# Patient Record
Sex: Male | Born: 1980 | Race: White | Hispanic: No | Marital: Married | Smoking: Never smoker
Health system: Southern US, Community
[De-identification: ages and names within clinical notes are randomized; demographics above are authoritative.]

## PROBLEM LIST (undated history)

## (undated) DIAGNOSIS — K5790 Diverticulosis of intestine, part unspecified, without perforation or abscess without bleeding: Secondary | ICD-10-CM

---
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
## 2010-10-19 HISTORY — PX: COLONOSCOPY: SHX174

## 2011-10-20 HISTORY — PX: WISDOM TOOTH EXTRACTION: SHX21

## 2011-12-14 ENCOUNTER — Encounter (HOSPITAL_COMMUNITY): Payer: Self-pay | Admitting: Pharmacy Technician

## 2011-12-14 ENCOUNTER — Other Ambulatory Visit: Payer: Self-pay | Admitting: Neurosurgery

## 2011-12-16 ENCOUNTER — Encounter (HOSPITAL_COMMUNITY)
Admission: RE | Admit: 2011-12-16 | Discharge: 2011-12-16 | Disposition: A | Discharge status: Home / Self Care | Payer: 59 | Source: Ambulatory Visit | Attending: Neurosurgery | Admitting: Neurosurgery

## 2011-12-16 ENCOUNTER — Encounter (HOSPITAL_COMMUNITY): Payer: Self-pay

## 2011-12-16 LAB — URINALYSIS, ROUTINE W REFLEX MICROSCOPIC
Ketones, ur: NEGATIVE mg/dL
Leukocytes, UA: NEGATIVE
Nitrite: NEGATIVE
Protein, ur: NEGATIVE mg/dL
Urobilinogen, UA: 0.2 mg/dL (ref 0.0–1.0)
pH: 7 (ref 5.0–8.0)

## 2011-12-16 LAB — CBC
MCV: 82.6 fL (ref 78.0–100.0)
Platelets: 293 10*3/uL (ref 150–400)
RDW: 12.3 % (ref 11.5–15.5)
WBC: 12 10*3/uL — ABNORMAL HIGH (ref 4.0–10.5)

## 2011-12-16 LAB — DIFFERENTIAL
Basophils Absolute: 0 10*3/uL (ref 0.0–0.1)
Basophils Relative: 0 % (ref 0–1)
Eosinophils Absolute: 0.2 10*3/uL (ref 0.0–0.7)
Eosinophils Relative: 2 % (ref 0–5)
Lymphocytes Relative: 22 % (ref 12–46)

## 2011-12-16 LAB — BASIC METABOLIC PANEL
CO2: 28 mEq/L (ref 19–32)
Calcium: 9.9 mg/dL (ref 8.4–10.5)
Creatinine, Ser: 0.86 mg/dL (ref 0.50–1.35)

## 2011-12-16 MED ORDER — CEFAZOLIN SODIUM 1-5 GM-% IV SOLN: 1.0000 g | INTRAVENOUS | Status: DC

## 2011-12-16 NOTE — Pre-Procedure Instructions (Signed)
22 Nicolas Patel  12/16/2011   Your procedure is scheduled on:  Thursday December 17, 2011  Report to [facility] at 10:30 AM.  Call this number if you have problems the morning of surgery: [phone]   Remember:   Do not eat food:After Midnight.  May have clear liquids: up to 4 Hours before arrival. ( up to 6:30am)  Clear liquids include soda, tea, black coffee, apple or grape juice, broth.  Take these medicines the morning of surgery with A SIP OF WATER:Hydrocodone   Do not wear jewelry, make-up or nail polish.  Do not wear lotions, powders, or perfumes. You may wear deodorant.  Do not shave 48 hours prior to surgery.  Do not bring valuables to the hospital.  Contacts, dentures or bridgework may not be worn into surgery.  Leave suitcase in the car. After surgery it may be brought to your room.  For patients admitted to the hospital, checkout time is 11:00 AM the day of discharge.   Patients discharged the day of surgery will not be allowed to drive home.  Name and phone number of your driver: *n/a**  Special Instructions: CHG Shower Use Special Wash: 1/2 bottle night before surgery and 1/2 bottle morning of surgery.   Please read over the following fact sheets that you were given: Pain Booklet, Coughing and Deep Breathing, MRSA Information and Surgical Site Infection Prevention

## 2011-12-17 ENCOUNTER — Encounter (HOSPITAL_COMMUNITY): Payer: Self-pay | Admitting: *Deleted

## 2011-12-17 ENCOUNTER — Encounter (HOSPITAL_COMMUNITY)
Admission: RE | Disposition: A | Discharge status: Home / Self Care | Payer: Self-pay | Source: Ambulatory Visit | Attending: Neurosurgery

## 2011-12-17 ENCOUNTER — Ambulatory Visit (HOSPITAL_COMMUNITY)
Admission: RE | Admit: 2011-12-17 | Discharge: 2011-12-18 | Disposition: A | Discharge status: Home / Self Care | Payer: 59 | Source: Ambulatory Visit | Attending: Neurosurgery | Admitting: Neurosurgery

## 2011-12-17 ENCOUNTER — Encounter (HOSPITAL_COMMUNITY): Payer: Self-pay | Admitting: Certified Registered"

## 2011-12-17 ENCOUNTER — Ambulatory Visit (HOSPITAL_COMMUNITY): Payer: 59 | Admitting: Certified Registered"

## 2011-12-17 ENCOUNTER — Ambulatory Visit (HOSPITAL_COMMUNITY): Payer: 59

## 2011-12-17 DIAGNOSIS — Z01812 Encounter for preprocedural laboratory examination: Secondary | ICD-10-CM | POA: Insufficient documentation

## 2011-12-17 DIAGNOSIS — M5106 Intervertebral disc disorders with myelopathy, lumbar region: Secondary | ICD-10-CM | POA: Insufficient documentation

## 2011-12-17 HISTORY — PX: LUMBAR LAMINECTOMY/DECOMPRESSION MICRODISCECTOMY: SHX5026

## 2011-12-17 LAB — PROTIME-INR
INR: 1 (ref 0.00–1.49)
Prothrombin Time: 13.4 seconds (ref 11.6–15.2)

## 2011-12-17 LAB — APTT: aPTT: 36 seconds (ref 24–37)

## 2011-12-17 SURGERY — LUMBAR LAMINECTOMY/DECOMPRESSION MICRODISCECTOMY
Anesthesia: General | Site: Back | Laterality: Right | Wound class: Clean

## 2011-12-17 MED ORDER — CEFAZOLIN SODIUM-DEXTROSE 2-3 GM-% IV SOLR
INTRAVENOUS | Status: AC
  Filled 2011-12-17: qty 50

## 2011-12-17 MED ORDER — ONDANSETRON HCL 4 MG/2ML IJ SOLN: 4.0000 mg | INTRAMUSCULAR | Status: DC | PRN

## 2011-12-17 MED ORDER — ACETAMINOPHEN 650 MG RE SUPP: 650.0000 mg | RECTAL | Status: DC | PRN

## 2011-12-17 MED ORDER — HYDROMORPHONE HCL PF 1 MG/ML IJ SOLN
INTRAMUSCULAR | Status: AC
  Filled 2011-12-17: qty 1

## 2011-12-17 MED ORDER — PROMETHAZINE HCL 25 MG PO TABS: 12.5000 mg | ORAL_TABLET | ORAL | Status: DC | PRN

## 2011-12-17 MED ORDER — ONDANSETRON HCL 4 MG/2ML IJ SOLN: 4.0000 mg | Freq: Once | INTRAMUSCULAR | Status: DC | PRN

## 2011-12-17 MED ORDER — VECURONIUM BROMIDE 10 MG IV SOLR
INTRAVENOUS | Status: DC | PRN
  Administered 2011-12-17: 6 mg via INTRAVENOUS

## 2011-12-17 MED ORDER — MORPHINE SULFATE 2 MG/ML IJ SOLN: 0.0500 mg/kg | INTRAMUSCULAR | Status: DC | PRN

## 2011-12-17 MED ORDER — PHENOL 1.4 % MT LIQD: 1.0000 | OROMUCOSAL | Status: DC | PRN

## 2011-12-17 MED ORDER — MEPERIDINE HCL 25 MG/ML IJ SOLN: 6.2500 mg | INTRAMUSCULAR | Status: DC | PRN

## 2011-12-17 MED ORDER — HYDROMORPHONE HCL PF 1 MG/ML IJ SOLN
0.2500 mg | INTRAMUSCULAR | Status: DC | PRN
  Administered 2011-12-17: 0.5 mg via INTRAVENOUS

## 2011-12-17 MED ORDER — FENTANYL CITRATE 0.05 MG/ML IJ SOLN
INTRAMUSCULAR | Status: DC | PRN
  Administered 2011-12-17: 75 ug via INTRAVENOUS
  Administered 2011-12-17: 50 ug via INTRAVENOUS
  Administered 2011-12-17: 75 ug via INTRAVENOUS
  Administered 2011-12-17: 50 ug via INTRAVENOUS
  Administered 2011-12-17: 100 ug via INTRAVENOUS
  Administered 2011-12-17: 50 ug via INTRAVENOUS

## 2011-12-17 MED ORDER — SODIUM CHLORIDE 0.9 % IJ SOLN
3.0000 mL | Freq: Two times a day (BID) | INTRAMUSCULAR | Status: DC
  Administered 2011-12-17: 3 mL via INTRAVENOUS

## 2011-12-17 MED ORDER — MORPHINE SULFATE 4 MG/ML IJ SOLN: 1.0000 mg | INTRAMUSCULAR | Status: DC | PRN

## 2011-12-17 MED ORDER — HEMOSTATIC AGENTS (NO CHARGE) OPTIME
TOPICAL | Status: DC | PRN
  Administered 2011-12-17: 1 via TOPICAL

## 2011-12-17 MED ORDER — DOCUSATE SODIUM 100 MG PO CAPS
100.0000 mg | ORAL_CAPSULE | Freq: Two times a day (BID) | ORAL | Status: DC
  Administered 2011-12-17: 100 mg via ORAL
  Filled 2011-12-17: qty 1

## 2011-12-17 MED ORDER — ADULT MULTIVITAMIN W/MINERALS CH
1.0000 | ORAL_TABLET | Freq: Every day | ORAL | Status: DC
  Filled 2011-12-17 (×2): qty 1

## 2011-12-17 MED ORDER — LIDOCAINE HCL (CARDIAC) 20 MG/ML IV SOLN
INTRAVENOUS | Status: DC | PRN
  Administered 2011-12-17: 60 mg via INTRAVENOUS

## 2011-12-17 MED ORDER — SODIUM CHLORIDE 0.9 % IV SOLN
INTRAVENOUS | Status: AC
  Filled 2011-12-17: qty 500

## 2011-12-17 MED ORDER — CYCLOBENZAPRINE HCL 10 MG PO TABS: 10.0000 mg | ORAL_TABLET | Freq: Three times a day (TID) | ORAL | Status: DC | PRN

## 2011-12-17 MED ORDER — PROPOFOL 10 MG/ML IV BOLUS
INTRAVENOUS | Status: DC | PRN
  Administered 2011-12-17: 200 mg via INTRAVENOUS

## 2011-12-17 MED ORDER — SODIUM CHLORIDE 0.9 % IJ SOLN: 3.0000 mL | INTRAMUSCULAR | Status: DC | PRN

## 2011-12-17 MED ORDER — LACTATED RINGERS IV SOLN
INTRAVENOUS | Status: DC | PRN
  Administered 2011-12-17 (×3): via INTRAVENOUS

## 2011-12-17 MED ORDER — MAGNESIUM HYDROXIDE 400 MG/5ML PO SUSP: 30.0000 mL | Freq: Every day | ORAL | Status: DC | PRN

## 2011-12-17 MED ORDER — THROMBIN 5000 UNITS EX SOLR
CUTANEOUS | Status: DC | PRN
  Administered 2011-12-17 (×2): 5000 [IU] via TOPICAL

## 2011-12-17 MED ORDER — BACITRACIN 50000 UNITS IM SOLR
INTRAMUSCULAR | Status: AC
  Filled 2011-12-17: qty 1

## 2011-12-17 MED ORDER — METHOCARBAMOL 100 MG/ML IJ SOLN: 500.0000 mg | Freq: Four times a day (QID) | INTRAVENOUS | Status: DC | PRN

## 2011-12-17 MED ORDER — KCL IN DEXTROSE-NACL 20-5-0.45 MEQ/L-%-% IV SOLN
INTRAVENOUS | Status: DC
  Filled 2011-12-17 (×3): qty 1000

## 2011-12-17 MED ORDER — MIDAZOLAM HCL 5 MG/5ML IJ SOLN
INTRAMUSCULAR | Status: DC | PRN
  Administered 2011-12-17: 2 mg via INTRAVENOUS

## 2011-12-17 MED ORDER — ZOLPIDEM TARTRATE 5 MG PO TABS: 10.0000 mg | ORAL_TABLET | Freq: Every evening | ORAL | Status: DC | PRN

## 2011-12-17 MED ORDER — PROMETHAZINE HCL 25 MG/ML IJ SOLN: 12.5000 mg | INTRAMUSCULAR | Status: DC | PRN

## 2011-12-17 MED ORDER — METHOCARBAMOL 500 MG PO TABS: 500.0000 mg | ORAL_TABLET | Freq: Four times a day (QID) | ORAL | Status: DC | PRN

## 2011-12-17 MED ORDER — CEFAZOLIN SODIUM-DEXTROSE 2-3 GM-% IV SOLR: 2.0000 g | INTRAVENOUS | Status: DC

## 2011-12-17 MED ORDER — ACETAMINOPHEN 325 MG PO TABS: 650.0000 mg | ORAL_TABLET | ORAL | Status: DC | PRN

## 2011-12-17 MED ORDER — SODIUM CHLORIDE 0.9 % IV SOLN: 250.0000 mL | INTRAVENOUS | Status: DC

## 2011-12-17 MED ORDER — LIDOCAINE-EPINEPHRINE 1 %-1:100000 IJ SOLN
INTRAMUSCULAR | Status: DC | PRN
  Administered 2011-12-17: 20 mL

## 2011-12-17 MED ORDER — SODIUM CHLORIDE 0.9 % IR SOLN
Status: DC | PRN
  Administered 2011-12-17: 17:00:00

## 2011-12-17 MED ORDER — OXYCODONE-ACETAMINOPHEN 5-325 MG PO TABS: 1.0000 | ORAL_TABLET | ORAL | Status: DC | PRN

## 2011-12-17 MED ORDER — CEFAZOLIN SODIUM 1-5 GM-% IV SOLN
1.0000 g | Freq: Three times a day (TID) | INTRAVENOUS | Status: AC
  Administered 2011-12-17 – 2011-12-18 (×2): 1 g via INTRAVENOUS
  Filled 2011-12-17 (×2): qty 50

## 2011-12-17 MED ORDER — 0.9 % SODIUM CHLORIDE (POUR BTL) OPTIME
TOPICAL | Status: DC | PRN
  Administered 2011-12-17: 1000 mL

## 2011-12-17 MED ORDER — LIDOCAINE HCL 4 % MT SOLN
OROMUCOSAL | Status: DC | PRN
  Administered 2011-12-17: 4 mL via TOPICAL

## 2011-12-17 MED ORDER — KETOROLAC TROMETHAMINE 30 MG/ML IJ SOLN
30.0000 mg | Freq: Four times a day (QID) | INTRAMUSCULAR | Status: DC
  Administered 2011-12-17 – 2011-12-18 (×2): 30 mg via INTRAVENOUS
  Filled 2011-12-17 (×4): qty 1

## 2011-12-17 MED ORDER — HYDROCODONE-ACETAMINOPHEN 5-325 MG PO TABS
1.0000 | ORAL_TABLET | ORAL | Status: DC | PRN
  Administered 2011-12-17: 2 via ORAL
  Filled 2011-12-17: qty 2

## 2011-12-17 MED ORDER — MENTHOL 3 MG MT LOZG: 1.0000 | LOZENGE | OROMUCOSAL | Status: DC | PRN

## 2011-12-17 SURGICAL SUPPLY — 56 items
BAG DECANTER FOR FLEXI CONT (MISCELLANEOUS) ×2 IMPLANT
BENZOIN TINCTURE PRP APPL 2/3 (GAUZE/BANDAGES/DRESSINGS) ×2 IMPLANT
BLADE SURG ROTATE 9660 (MISCELLANEOUS) IMPLANT
BUR ACORN 6.0 ACORN (BURR) ×2 IMPLANT
BUR ACRON 5.0MM COATED (BURR) IMPLANT
CANISTER SUCTION 2500CC (MISCELLANEOUS) ×2 IMPLANT
CLOSURE STERI STRIP 1/2 X4 (GAUZE/BANDAGES/DRESSINGS) ×2 IMPLANT
CLOTH BEACON ORANGE TIMEOUT ST (SAFETY) ×2 IMPLANT
CONT SPEC 4OZ CLIKSEAL STRL BL (MISCELLANEOUS) IMPLANT
DRAPE LAPAROTOMY 100X72X124 (DRAPES) ×2 IMPLANT
DRAPE MICROSCOPE LEICA (MISCELLANEOUS) ×2 IMPLANT
DRAPE POUCH INSTRU U-SHP 10X18 (DRAPES) ×2 IMPLANT
DRAPE SURG 17X23 STRL (DRAPES) ×2 IMPLANT
DRESSING TELFA 8X3 (GAUZE/BANDAGES/DRESSINGS) ×2 IMPLANT
DURAPREP 26ML APPLICATOR (WOUND CARE) ×2 IMPLANT
ELECT REM PT RETURN 9FT ADLT (ELECTROSURGICAL) ×2
ELECTRODE REM PT RTRN 9FT ADLT (ELECTROSURGICAL) ×1 IMPLANT
GAUZE SPONGE 4X4 12PLY STRL LF (GAUZE/BANDAGES/DRESSINGS) ×2 IMPLANT
GAUZE SPONGE 4X4 16PLY XRAY LF (GAUZE/BANDAGES/DRESSINGS) IMPLANT
GLOVE BIO SURGEON STRL SZ8 (GLOVE) ×4 IMPLANT
GLOVE BIOGEL PI IND STRL 7.5 (GLOVE) ×1 IMPLANT
GLOVE BIOGEL PI IND STRL 8 (GLOVE) ×1 IMPLANT
GLOVE BIOGEL PI INDICATOR 7.5 (GLOVE) ×1
GLOVE BIOGEL PI INDICATOR 8 (GLOVE) ×1
GLOVE ECLIPSE 7.5 STRL STRAW (GLOVE) ×6 IMPLANT
GLOVE EXAM NITRILE LRG STRL (GLOVE) IMPLANT
GLOVE EXAM NITRILE MD LF STRL (GLOVE) IMPLANT
GLOVE EXAM NITRILE XL STR (GLOVE) IMPLANT
GLOVE EXAM NITRILE XS STR PU (GLOVE) IMPLANT
GOWN BRE IMP SLV AUR LG STRL (GOWN DISPOSABLE) ×2 IMPLANT
GOWN BRE IMP SLV AUR XL STRL (GOWN DISPOSABLE) ×2 IMPLANT
GOWN STRL REIN 2XL LVL4 (GOWN DISPOSABLE) ×2 IMPLANT
KIT BASIN OR (CUSTOM PROCEDURE TRAY) ×2 IMPLANT
KIT ROOM TURNOVER OR (KITS) ×2 IMPLANT
NEEDLE HYPO 18GX1.5 BLUNT FILL (NEEDLE) IMPLANT
NEEDLE HYPO 22GX1.5 SAFETY (NEEDLE) ×4 IMPLANT
NS IRRIG 1000ML POUR BTL (IV SOLUTION) ×2 IMPLANT
PACK LAMINECTOMY NEURO (CUSTOM PROCEDURE TRAY) ×2 IMPLANT
PAD ARMBOARD 7.5X6 YLW CONV (MISCELLANEOUS) ×6 IMPLANT
PATTIES SURGICAL .75X.75 (GAUZE/BANDAGES/DRESSINGS) ×2 IMPLANT
RUBBERBAND STERILE (MISCELLANEOUS) ×4 IMPLANT
SPONGE GAUZE 4X4 12PLY (GAUZE/BANDAGES/DRESSINGS) ×2 IMPLANT
SPONGE LAP 4X18 X RAY DECT (DISPOSABLE) IMPLANT
SPONGE SURGIFOAM ABS GEL SZ50 (HEMOSTASIS) ×2 IMPLANT
STRIP CLOSURE SKIN 1/2X4 (GAUZE/BANDAGES/DRESSINGS) ×2 IMPLANT
SUT PROLENE 6 0 BV (SUTURE) IMPLANT
SUT VIC AB 0 CT1 18XCR BRD8 (SUTURE) ×1 IMPLANT
SUT VIC AB 0 CT1 8-18 (SUTURE) ×1
SUT VIC AB 2-0 CP2 18 (SUTURE) ×2 IMPLANT
SUT VIC AB 3-0 SH 8-18 (SUTURE) ×2 IMPLANT
SYR 20CC LL (SYRINGE) ×2 IMPLANT
SYR 5ML LL (SYRINGE) IMPLANT
TAPE CLOTH SURG 4X10 WHT LF (GAUZE/BANDAGES/DRESSINGS) ×2 IMPLANT
TOWEL OR 17X24 6PK STRL BLUE (TOWEL DISPOSABLE) ×2 IMPLANT
TOWEL OR 17X26 10 PK STRL BLUE (TOWEL DISPOSABLE) ×2 IMPLANT
WATER STERILE IRR 1000ML POUR (IV SOLUTION) ×2 IMPLANT

## 2011-12-17 NOTE — Anesthesia Procedure Notes (Addendum)
Procedure Name: Intubation Date/Time: 12/17/2011 4:17 PM Performed by: Alanda Amass Pre-anesthesia Checklist: Patient identified, Timeout performed, Emergency Drugs available, Suction available and Patient being monitored Patient Re-evaluated:Patient Re-evaluated prior to inductionOxygen Delivery Method: Circle system utilized Preoxygenation: Pre-oxygenation with 100% oxygen Intubation Type: IV induction Ventilation: Mask ventilation without difficulty Laryngoscope Size: Mac and 3 Grade View: Grade II Tube type: Oral Tube size: 8.0 mm Number of attempts: 1 Airway Equipment and Method: Stylet Placement Confirmation: ETT inserted through vocal cords under direct vision,  breath sounds checked- equal and bilateral and positive ETCO2 Secured at: 22 cm Tube secured with: Tape Dental Injury: Teeth and Oropharynx as per pre-operative assessment

## 2011-12-17 NOTE — Op Note (Signed)
12/17/2011  5:27 PM  PATIENT:  Nicolas Patel  31 y.o. male  PRE-OPERATIVE DIAGNOSIS:  Lumbar herniated nucleus pulposus with myelopathy, Lumbar radiculopathy  POST-OPERATIVE DIAGNOSIS:  Lumbar herniated nucleus pulposus with myelopathy, Lumbar radiculopathy  PROCEDURE:  Procedure(s): LUMBAR LAMINECTOMY/DECOMPRESSION MICRODISCECTOMY  SURGEON:  Surgeon(s): Clydene Fake, MD Dorian Heckle, MD  ASSISTANTS:  ANESTHESIA:   general  EBL:  Total I/O In: 1000 [I.V.:1000] Out: -   BLOOD ADMINISTERED:none  DRAINS: none   SPECIMEN:  No Specimen  DICTATION: Patient with) leg pain and the numbness large right C5 and disc herniation elect to proceed with surgery admitted to in for such.  Patient brought out through general anesthesia induced patient placed in prone position in a Wilson frame with all pressure points padded. Patient prepped draped in a sterile fashion an incision injected with 20 cc 1% lidocaine. A spinal needle placed in interspace x-rays obtained from the needle was putting at 51 interspace incision was then made centered with needle was incision taken down to fascia hemostasis obtained with Bovie cauterization subperiosteal dissection done with L5 and S1 spinous process lamina out to the facet self-retaining retractor was placed markers placed interspace another x-ray was obtained confirming or positioning and L5-S1.  High-speed drill was used to starting semi-hemilaminectomy medial facetectomy this was completed with Kerrison punches the ligamentum flavum was removed with the Kerrison the dorsal central canal and foraminotomy over the S1 root. We explored the epidural base and a large disc herniation. Microscope was brought in for microdissection at the time of the central laminectomy. Similar dissection techniques we carefully retracted the nerve root medially incised is displaced and discectomy done with pituitary rongeurs and curettes. We were finished we did  decompression the central canal and the L5-S1 roots. Hemostasis with bipolar cauterization with thrombin Gelfoam was. That's restored good hemostasis the retractors removed fascia closed with 0 Vicryl interrupted sutures subcutaneous tissue closed with 02 and 3-0 Vicryl interrupted sutures skin closed with benzoin and Steri-Strips dressing was placed patient was placed back in the spinal position woken from anesthesia and transferred to recovery room  PLAN OF CARE: Admit for overnight observation  PATIENT DISPOSITION:  PACU - hemodynamically stable.

## 2011-12-17 NOTE — Anesthesia Postprocedure Evaluation (Signed)
  Anesthesia Post-op Note  Patient: Nicolas Patel  Procedure(s) Performed: Procedure(s) (LRB): LUMBAR LAMINECTOMY/DECOMPRESSION MICRODISCECTOMY (Right)  Patient Location: PACU  Anesthesia Type: General  Level of Consciousness: awake, alert  and oriented  Airway and Oxygen Therapy: Patient Spontanous Breathing and Patient connected to nasal cannula oxygen  Post-op Pain: none  Post-op Assessment: Post-op Vital signs reviewed, Patient's Cardiovascular Status Stable, Respiratory Function Stable, Patent Airway, No signs of Nausea or vomiting and Pain level controlled  Post-op Vital Signs: Reviewed and stable  Complications: No apparent anesthesia complications

## 2011-12-17 NOTE — Anesthesia Preprocedure Evaluation (Addendum)
Anesthesia Evaluation  Patient identified by MRN, date of birth, ID band Patient awake    Reviewed: Allergy & Precautions, H&P , NPO status , Patient's Chart, lab work & pertinent test results  Airway Mallampati: II TM Distance: >3 FB Neck ROM: Full    Dental  (+) Dental Advisory Given   Pulmonary          Cardiovascular     Neuro/Psych    GI/Hepatic   Endo/Other    Renal/GU      Musculoskeletal   Abdominal   Peds  Hematology   Anesthesia Other Findings   Reproductive/Obstetrics                          Anesthesia Physical Anesthesia Plan  ASA: II  Anesthesia Plan: General   Post-op Pain Management:    Induction: Intravenous  Airway Management Planned: Oral ETT  Additional Equipment:   Intra-op Plan:   Post-operative Plan: Extubation in OR  Informed Consent: I have reviewed the patients History and Physical, chart, labs and discussed the procedure including the risks, benefits and alternatives for the proposed anesthesia with the patient or authorized representative who has indicated his/her understanding and acceptance.     Plan Discussed with: CRNA and Surgeon  Anesthesia Plan Comments:         Anesthesia Quick Evaluation

## 2011-12-17 NOTE — Progress Notes (Signed)
Dr. Michelle Piper notified patient had a sip of water at 0900 on his way into Short Stay.

## 2011-12-17 NOTE — H&P (Signed)
See h/p

## 2011-12-17 NOTE — Transfer of Care (Signed)
Immediate Anesthesia Transfer of Care Note  Patient: Nicolas Patel  Procedure(s) Performed: Procedure(s) (LRB): LUMBAR LAMINECTOMY/DECOMPRESSION MICRODISCECTOMY (Right)  Patient Location: PACU  Anesthesia Type: General  Level of Consciousness: sedated  Airway & Oxygen Therapy: Patient Spontanous Breathing and Patient connected to nasal cannula oxygen  Post-op Assessment: Report given to PACU RN and Post -op Vital signs reviewed and stable  Post vital signs: Reviewed and stable  Complications: No apparent anesthesia complications

## 2011-12-17 NOTE — Interval H&P Note (Signed)
History and Physical Interval Note:  12/17/2011 9:24 AM  Nicolas Patel  has presented today for surgery, with the diagnosis of Lumbar hnp with myelopathy, Lumbar radiculopathy  The various methods of treatment have been discussed with the patient and family. After consideration of risks, benefits and other options for treatment, the patient has consented to  Procedure(s) (LRB): LUMBAR LAMINECTOMY/DECOMPRESSION MICRODISCECTOMY (Right) as a surgical intervention .  The patients' history has been reviewed, patient examined, no change in status, stable for surgery.  I have reviewed the patients' chart and labs.  Questions were answered to the patient's satisfaction.     Modestine Scherzinger R

## 2011-12-18 MED ORDER — HYDROCODONE-ACETAMINOPHEN 5-325 MG PO TABS: 1.0000 | ORAL_TABLET | ORAL | Status: AC | PRN

## 2011-12-18 MED ORDER — CYCLOBENZAPRINE HCL 10 MG PO TABS: 10.0000 mg | ORAL_TABLET | Freq: Three times a day (TID) | ORAL | Status: AC | PRN

## 2011-12-18 NOTE — Discharge Summary (Signed)
Physician Discharge Summary  Patient ID: HARUTO DEMARIA MRN: [id number] DOB/AGE: Jul 29, 1981 29 y.o.  Admit date: 12/17/2011 Discharge date: 12/18/2011  Admission Diagnoses:Lumbar herniated nucleus pulposus with myelopathy, Lumbar radiculopathy   Discharge Diagnoses: Lumbar herniated nucleus pulposus with myelopathy, Lumbar radiculopathy  Active Problems:  * No active hospital problems. *    Discharged Condition: good  Hospital Course: pt admittted day of surgery, underwent procedure below - . Pt with no leg pain, min incisional pain, ambulating, voiding well  Consults: None   Treatments: surgery: LUMBAR LAMINECTOMY/DECOMPRESSION MICRODISCECTOMY   Discharge Exam: Blood pressure 132/64, pulse 60, temperature 97.5 F (36.4 C), temperature source Oral, resp. rate 20, SpO2 98.00%. Wound:c/d/i  Disposition: home   Medication List  As of 12/18/2011  8:28 AM   TAKE these medications         acetaminophen 500 MG tablet   Commonly known as: TYLENOL   Take 1,000 mg by mouth.      cyclobenzaprine 10 MG tablet   Commonly known as: FLEXERIL   Take 1 tablet (10 mg total) by mouth 3 (three) times daily as needed for muscle spasms.      diclofenac 25 MG EC tablet   Commonly known as: VOLTAREN   Take 25 mg by mouth 2 (two) times daily.      HYDROcodone-acetaminophen 5-325 MG per tablet   Commonly known as: NORCO   Take 1 tablet by mouth every 6 (six) hours as needed.      HYDROcodone-acetaminophen 5-325 MG per tablet   Commonly known as: NORCO   Take 1-2 tablets by mouth every 4 (four) hours as needed for pain.      mulitivitamin with minerals Tabs   Take 1 tablet by mouth daily.             SignedClydene Fake, MD 12/18/2011, 8:28 AM

## 2011-12-18 NOTE — Discharge Instructions (Signed)
Wound Care Keep incision covered and dry for 5 days.  If you shower prior to then, cover incision with plastic wrap.  You may remove outer bandage after 5 days and shower.  Do not put any creams, lotions, or ointments on incision. Leave steri-strips on.  They will fall off by themselves. Activity Walk each and every day, increasing distance each day. No lifting greater than 5 lbs.  Avoid bending, arching, or twisting. No driving for 2 weeks; may ride as a passenger locally. If provided with back brace, wear when out of bed.  It is not necessary to wear brace in bed. Diet Resume your normal diet.  Return to Work Will be discussed at you follow up appointment. Call Your Doctor If Any of These Occur Redness, drainage, or swelling at the wound.  Temperature greater than 101 degrees. Severe pain not relieved by pain medication. Incision starts to come apart. Follow Up Appt Call today for appointment in 3-4 weeks ([phone]) or for problems.  If you have any hardware placed in your spine, you will need an x-ray before your appointment.

## 2011-12-19 ENCOUNTER — Encounter (HOSPITAL_COMMUNITY): Payer: Self-pay | Admitting: Neurosurgery

## 2012-01-02 MED ORDER — CEFAZOLIN SODIUM 1-5 GM-% IV SOLN
INTRAVENOUS | Status: DC | PRN
  Administered 2011-12-17: 2 g via INTRAVENOUS

## 2012-01-02 NOTE — Addendum Note (Signed)
Addendum  created 01/02/12 2130 by Atilano Ina, CRNA   Modules edited:Anesthesia Medication Administration

## 2012-01-02 NOTE — Addendum Note (Signed)
Addendum  created 01/02/12 2140 by Atilano Ina, CRNA   Modules edited:Anesthesia Medication Administration

## 2016-10-20 ENCOUNTER — Emergency Department (HOSPITAL_COMMUNITY): Payer: 59

## 2016-10-20 ENCOUNTER — Encounter (HOSPITAL_COMMUNITY): Payer: Self-pay | Admitting: Emergency Medicine

## 2016-10-20 ENCOUNTER — Emergency Department (HOSPITAL_COMMUNITY)
Admission: EM | Admit: 2016-10-20 | Discharge: 2016-10-20 | Disposition: A | Discharge status: Home / Self Care | Payer: 59 | Attending: Emergency Medicine | Admitting: Emergency Medicine

## 2016-10-20 DIAGNOSIS — Z87891 Personal history of nicotine dependence: Secondary | ICD-10-CM | POA: Insufficient documentation

## 2016-10-20 DIAGNOSIS — K529 Noninfective gastroenteritis and colitis, unspecified: Secondary | ICD-10-CM | POA: Diagnosis not present

## 2016-10-20 DIAGNOSIS — R1032 Left lower quadrant pain: Secondary | ICD-10-CM | POA: Diagnosis present

## 2016-10-20 HISTORY — DX: Diverticulosis of intestine, part unspecified, without perforation or abscess without bleeding: K57.90

## 2016-10-20 LAB — CBC
HCT: 43 % (ref 39.0–52.0)
HEMOGLOBIN: 14.5 g/dL (ref 13.0–17.0)
MCH: 29.2 pg (ref 26.0–34.0)
MCHC: 33.7 g/dL (ref 30.0–36.0)
MCV: 86.5 fL (ref 78.0–100.0)
Platelets: 226 10*3/uL (ref 150–400)
RBC: 4.97 MIL/uL (ref 4.22–5.81)
RDW: 12.9 % (ref 11.5–15.5)
WBC: 12 10*3/uL — AB (ref 4.0–10.5)

## 2016-10-20 LAB — COMPREHENSIVE METABOLIC PANEL
ALBUMIN: 4 g/dL (ref 3.5–5.0)
ALK PHOS: 93 U/L (ref 38–126)
ALT: 26 U/L (ref 17–63)
ANION GAP: 8 (ref 5–15)
AST: 26 U/L (ref 15–41)
BUN: 12 mg/dL (ref 6–20)
CALCIUM: 8.9 mg/dL (ref 8.9–10.3)
CO2: 27 mmol/L (ref 22–32)
Chloride: 99 mmol/L — ABNORMAL LOW (ref 101–111)
Creatinine, Ser: 1 mg/dL (ref 0.61–1.24)
GFR calc Af Amer: >60 mL/min (ref >60)
GLUCOSE: 99 mg/dL (ref 65–99)
Potassium: 4 mmol/L (ref 3.5–5.1)
Sodium: 134 mmol/L — ABNORMAL LOW (ref 135–145)
Total Bilirubin: 0.8 mg/dL (ref 0.3–1.2)
Total Protein: 7.5 g/dL (ref 6.5–8.1)

## 2016-10-20 LAB — POC OCCULT BLOOD, ED: Fecal Occult Bld: POSITIVE — AB

## 2016-10-20 LAB — TYPE AND SCREEN
ABO/RH(D): O POS
ANTIBODY SCREEN: NEGATIVE

## 2016-10-20 MED ORDER — CIPROFLOXACIN IN D5W 400 MG/200ML IV SOLN
400.0000 mg | Freq: Once | INTRAVENOUS | Status: AC
  Administered 2016-10-20: 400 mg via INTRAVENOUS
  Filled 2016-10-20: qty 200

## 2016-10-20 MED ORDER — SODIUM CHLORIDE 0.9 % IV BOLUS (SEPSIS)
1000.0000 mL | Freq: Once | INTRAVENOUS | Status: AC
Start: 2016-10-20 — End: 2016-10-20
  Administered 2016-10-20: 1000 mL via INTRAVENOUS

## 2016-10-20 MED ORDER — CIPROFLOXACIN HCL 500 MG PO TABS: 500.0000 mg | ORAL_TABLET | Freq: Two times a day (BID) | ORAL | 0 refills | Status: AC

## 2016-10-20 MED ORDER — METRONIDAZOLE 500 MG PO TABS: 500.0000 mg | ORAL_TABLET | Freq: Four times a day (QID) | ORAL | 0 refills | Status: AC

## 2016-10-20 MED ORDER — IOPAMIDOL (ISOVUE-300) INJECTION 61%
INTRAVENOUS | Status: AC
  Administered 2016-10-20: 30 mL
  Filled 2016-10-20: qty 30

## 2016-10-20 MED ORDER — IOPAMIDOL (ISOVUE-300) INJECTION 61%
100.0000 mL | Freq: Once | INTRAVENOUS | Status: AC | PRN
  Administered 2016-10-20: 100 mL via INTRAVENOUS

## 2016-10-20 MED ORDER — SODIUM CHLORIDE 0.9 % IV BOLUS (SEPSIS)
1000.0000 mL | Freq: Once | INTRAVENOUS | Status: AC
  Administered 2016-10-20: 1000 mL via INTRAVENOUS

## 2016-10-20 NOTE — Discharge Instructions (Signed)
Follow-up with your stomach doctor or Dr. [place] by Friday this week. Return if getting worse

## 2016-10-20 NOTE — ED Triage Notes (Signed)
Patient complains of abdominal pain that started Sunday. States diarrhea with some bright red blood in stool. Denies dark tarry stools. States history of diverticulosis.

## 2016-10-21 ENCOUNTER — Telehealth: Payer: Self-pay | Admitting: Gastroenterology

## 2016-10-21 NOTE — Telephone Encounter (Signed)
Pt is aware of OV on Friday at 1030

## 2016-10-21 NOTE — Telephone Encounter (Signed)
Yes, we can accept him. As he was just seen by ED yesterday evening, could plan on Friday or Monday.

## 2016-10-21 NOTE — Telephone Encounter (Signed)
Pt was seen in the ER for bloody diarrhea and was told to follow up with us this week. Please advise if we can accept him as a new patient and if this is something I need to put in Urgent spot.

## 2016-10-22 NOTE — ED Provider Notes (Signed)
[facility]PT Provider Note   CSN: [id number] Arrival date & time: 10/20/16  1804     History   Chief Complaint Chief Complaint  Patient presents with  . Abdominal Pain    HPI Nicolas Patel is a 36 y.o. male.  Pt complains of abd pain and bloody diarrhea   The history is provided by the patient. No language interpreter was used.  Abdominal Pain   This is a new problem. The current episode started 12 to 24 hours ago. The problem occurs constantly. The problem has not changed since onset.The pain is associated with an unknown factor. The pain is located in the LLQ. The pain is at a severity of 3/10. Associated symptoms include diarrhea. Pertinent negatives include frequency, hematuria and headaches.    Past Medical History:  Diagnosis Date  . Diverticulosis     There are no active problems to display for this patient.   Past Surgical History:  Procedure Laterality Date  . COLONOSCOPY  2012   HEMORRHOIDS  . HERNIA REPAIR  2002   RT INGUINAL  . LUMBAR LAMINECTOMY/DECOMPRESSION MICRODISCECTOMY  12/17/2011   Procedure: LUMBAR LAMINECTOMY/DECOMPRESSION MICRODISCECTOMY;  Surgeon: Clydene FakeJames R Hirsch, MD;  Location: [facility];  Service: Neurosurgery;  Laterality: Right;  Right Lumbar Five-Sacral One Diskectomy  . WISDOM TOOTH EXTRACTION  2013       Home Medications    Prior to Admission medications   Medication Sig Start Date End Date Taking? Authorizing Provider  Multiple Vitamin (MULITIVITAMIN WITH MINERALS) TABS Take 1 tablet by mouth daily.   Yes Historical Provider, MD  ciprofloxacin (CIPRO) 500 MG tablet Take 1 tablet (500 mg total) by mouth 2 (two) times daily. One po bid x 7 days 10/20/16   Bethann BerkshireJoseph Linley Moxley, MD  metroNIDAZOLE (FLAGYL) 500 MG tablet Take 1 tablet (500 mg total) by mouth 4 (four) times daily. One po bid x 7 days 10/20/16   Bethann BerkshireJoseph Myanna Ziesmer, MD    Family History Family History  Problem Relation Age of Onset  . Anesthesia problems Neg Hx     Social  History Social History  Substance Use Topics  . Smoking status: Never Smoker  . Smokeless tobacco: Former NeurosurgeonUser    Types: Chew  . Alcohol use Yes     Comment: occ     Allergies   Patient has no known allergies.   Review of Systems Review of Systems  Constitutional: Negative for appetite change and fatigue.  HENT: Negative for congestion, ear discharge and sinus pressure.   Eyes: Negative for discharge.  Respiratory: Negative for cough.   Cardiovascular: Negative for chest pain.  Gastrointestinal: Positive for abdominal pain and diarrhea.       Bloody diarrhea  Genitourinary: Negative for frequency and hematuria.  Musculoskeletal: Negative for back pain.  Skin: Negative for rash.  Neurological: Negative for seizures and headaches.  Psychiatric/Behavioral: Negative for hallucinations.     Physical Exam Updated Vital Signs BP 126/67 (BP Location: Right Arm)   Pulse 77   Temp 98.1 F (36.7 C) (Oral)   Resp 20   Ht 5\' 9"  (1.753 m)   Wt 230 lb (104.3 kg)   SpO2 96%   BMI 33.97 kg/m   Physical Exam  Constitutional: He is oriented to person, place, and time. He appears well-developed.  HENT:  Head: Normocephalic.  Eyes: Conjunctivae and EOM are normal. No scleral icterus.  Neck: Neck supple. No thyromegaly present.  Cardiovascular: Normal rate and regular rhythm.  Exam reveals no gallop  and no friction rub.   No murmur heard. Pulmonary/Chest: No stridor. He has no wheezes. He has no rales. He exhibits no tenderness.  Abdominal: He exhibits no distension. There is tenderness. There is no rebound.  Genitourinary:  Genitourinary Comments: Hem pos stool  Musculoskeletal: Normal range of motion. He exhibits no edema.  Lymphadenopathy:    He has no cervical adenopathy.  Neurological: He is oriented to person, place, and time. He exhibits normal muscle tone. Coordination normal.  Skin: No rash noted. No erythema.  Psychiatric: He has a normal mood and affect. His  behavior is normal.     ED Treatments / Results  Labs (all labs ordered are listed, but only abnormal results are displayed) Labs Reviewed  COMPREHENSIVE METABOLIC PANEL - Abnormal; Notable for the following:       Result Value   Sodium 134 (*)    Chloride 99 (*)    All other components within normal limits  CBC - Abnormal; Notable for the following:    WBC 12.0 (*)    All other components within normal limits  POC OCCULT BLOOD, ED - Abnormal; Notable for the following:    Fecal Occult Bld POSITIVE (*)    All other components within normal limits  TYPE AND SCREEN    EKG  EKG Interpretation  Date/Time:  Tuesday October 20 2016 20:28:21 EST Ventricular Rate:  81 PR Interval:    QRS Duration: 85 QT Interval:  333 QTC Calculation: 387 R Axis:   31 Text Interpretation:  Sinus rhythm Low voltage, precordial leads ST elev, probable normal early repol pattern Confirmed by Teneka Malmberg  MD, Sanjith Siwek [phone]) on 10/20/2016 9:33:13 PM       Radiology Ct Abdomen Pelvis W Contrast  Result Date: 10/20/2016 CLINICAL DATA:  Abdominal pain for 3 days, diarrhea and bright red blood in stools. History of diverticulosis. EXAM: CT ABDOMEN AND PELVIS WITH CONTRAST TECHNIQUE: Multidetector CT imaging of the abdomen and pelvis was performed using the standard protocol following bolus administration of intravenous contrast. CONTRAST:  ISOVUE-300 IOPAMIDOL (ISOVUE-300) INJECTION 61% COMPARISON:  None. FINDINGS: LOWER CHEST: Lung bases are clear. Included heart size is normal. No pericardial effusion. HEPATOBILIARY: Liver and gallbladder are normal. PANCREAS: Normal. SPLEEN: Normal. ADRENALS/URINARY TRACT: Kidneys are orthotopic, demonstrating symmetric enhancement. No nephrolithiasis, hydronephrosis or solid renal masses. The unopacified ureters are normal in course and caliber. Urinary bladder is partially distended and unremarkable. Normal adrenal glands. STOMACH/BOWEL: Enteric contrast has not yet reached  the distal small bowel. Mild diffuse colonic wall thickening and inflammation most apparent within the RIGHT colon again, rectosigmoid junction. No bowel obstruction. The stomach, small bowel are normal in course and caliber without inflammatory changes. Normal appendix. VASCULAR/LYMPHATIC: Aortoiliac vessels are normal in course and caliber. No lymphadenopathy by CT size criteria. REPRODUCTIVE: Normal. OTHER: No intraperitoneal free fluid or free air. MUSCULOSKELETAL: Nonacute.  Small fat containing inguinal hernias. IMPRESSION: Colitis, which may be infectious or inflammatory. No diverticulitis or appendicitis. Electronically Signed   By: Awilda Metro M.D.   On: 10/20/2016 22:24    Procedures Procedures (including critical care time)  Medications Ordered in ED Medications  sodium chloride 0.9 % bolus 1,000 mL (0 mLs Intravenous Stopped 10/20/16 2229)  iopamidol (ISOVUE-300) 61 % injection (30 mLs  Contrast Given 10/20/16 2135)  iopamidol (ISOVUE-300) 61 % injection 100 mL (100 mLs Intravenous Contrast Given 10/20/16 2135)  sodium chloride 0.9 % bolus 1,000 mL (0 mLs Intravenous Stopped 10/20/16 2336)  ciprofloxacin (CIPRO) IVPB 400 mg (  0 mg Intravenous Stopped 10/20/16 2336)     Initial Impression / Assessment and Plan / ED Course  I have reviewed the triage vital signs and the nursing notes.  Pertinent labs & imaging results that were available during my care of the patient were reviewed by me and considered in my medical decision making (see chart for details).  Clinical Course    Colitis,  tx with cipro and flagyl and follow up with gi  Final Clinical Impressions(s) / ED Diagnoses   Final diagnoses:  Colitis    New Prescriptions Discharge Medication List as of 10/20/2016 11:09 PM    START taking these medications   Details  ciprofloxacin (CIPRO) 500 MG tablet Take 1 tablet (500 mg total) by mouth 2 (two) times daily. One po bid x 7 days, Starting Tue 10/20/2016, Print      metroNIDAZOLE (FLAGYL) 500 MG tablet Take 1 tablet (500 mg total) by mouth 4 (four) times daily. One po bid x 7 days, Starting Tue 10/20/2016, Print         Bethann Berkshire, MD 10/22/16 1150

## 2016-10-23 ENCOUNTER — Encounter: Payer: Self-pay | Admitting: Gastroenterology

## 2016-10-23 ENCOUNTER — Ambulatory Visit (INDEPENDENT_AMBULATORY_CARE_PROVIDER_SITE_OTHER): Payer: 59 | Admitting: Gastroenterology

## 2016-10-23 DIAGNOSIS — R933 Abnormal findings on diagnostic imaging of other parts of digestive tract: Secondary | ICD-10-CM | POA: Diagnosis not present

## 2016-10-23 DIAGNOSIS — R197 Diarrhea, unspecified: Secondary | ICD-10-CM

## 2016-10-23 NOTE — Progress Notes (Signed)
Primary Care Physician:  No PCP Per Patient  Primary Gastroenterologist:  Roetta Sessions, MD   Chief Complaint  Patient presents with  . Blood In Stools    bright red. Went to ER at [facility] 10/20/16  . Diarrhea    since 10/18/16  . Abdominal Pain    "at belt line"    HPI:  Nicolas Patel is a 36 y.o. male here for further evaluation of bloody diarrhea, abdominal pain. Patient states that he developed acute onset diarrhea which lasted for a few days back and middle of December. Symptoms associated with diarrhea, nausea and vomiting, lack of appetite. No rectal bleeding at the time. After few days he felt better and was back to normal. Over the weekend he developed recurrent diarrhea this time associated with left lower quadrant pain, 8-9 episodes of bloody stool. Blood first noted a couple days into the illness. He notes that his wife had been sick with diarrhea last week as well as his mother-in-law but both their symptoms subsided within 24 hours. He has not been sick. Denies any recent antibiotic use prior to illness. He does note that before both illnesses he did consume a significant amount of alcohol which he normally does not do. This last time he had 15 beers the night before symptoms started. He was seen in the ED. Heme positive stools noted. CT abdomen and pelvis showed mild diffuse colonic wall thickening and inflammation most apparent in the right colon, rectosigmoid junction. No diverticulitis or appendicitis. Patient was started on Cipro and Flagyl. 3 days into therapy, he feels 50-75% better. No longer having nocturnal diarrhea. Only sees blood when he wipes. Complains of anal rectal irritation from hemorrhoids and diarrhea. Using over-the-counter agents with adequate control of symptoms. Appetite is resumed.  It is notable that patient has his own beef cattle and chickens. He did clean out the chicken feed/coupe prior to his second illness. He believes he washes hands with soap and  water which usually does. Day prior to his last illness, he did have [place] and hamburgers, wife did as well did not get sick. Wife concerned also about Romaine lettuce/Escherichia coli outbreak stating that he a lot of Romaine hearts.   Chronically patient states he does fairly well. Generally his bowel movements are normal. He does have intermittent rectal bleeding with straining and lifting likely from known hemorrhoids. Patient had a colonoscopy he reports in 2012 at [facility] was told he had diverticulosis and hemorrhoids.   Current Outpatient Prescriptions  Medication Sig Dispense Refill  . ciprofloxacin (CIPRO) 500 MG tablet Take 1 tablet (500 mg total) by mouth 2 (two) times daily. One po bid x 7 days 14 tablet 0  . metroNIDAZOLE (FLAGYL) 500 MG tablet Take 1 tablet (500 mg total) by mouth 4 (four) times daily. One po bid x 7 days 28 tablet 0  . Multiple Vitamin (MULITIVITAMIN WITH MINERALS) TABS Take 1 tablet by mouth daily.     No current facility-administered medications for this visit.     Allergies as of 10/23/2016  . (No Known Allergies)    Past Medical History:  Diagnosis Date  . Diverticulosis     Past Surgical History:  Procedure Laterality Date  . COLONOSCOPY  2012   HEMORRHOIDS, [facility]  . HERNIA REPAIR  2002   RT INGUINAL  . LUMBAR LAMINECTOMY/DECOMPRESSION MICRODISCECTOMY  12/17/2011   Procedure: LUMBAR LAMINECTOMY/DECOMPRESSION MICRODISCECTOMY;  Surgeon: Clydene Fake, MD;  Location: [facility];  Service:  Neurosurgery;  Laterality: Right;  Right Lumbar Five-Sacral One Diskectomy  . WISDOM TOOTH EXTRACTION  2013    Family History  Problem Relation Age of Onset  . Anesthesia problems Neg Hx   . Colon cancer Neg Hx   . Inflammatory bowel disease Neg Hx     Social History   Social History  . Marital status: Married    Spouse name: N/A  . Number of children: N/A  . Years of education: N/A   Occupational History  . Curator    Social History Main  Topics  . Smoking status: Never Smoker  . Smokeless tobacco: Former Neurosurgeon    Types: Chew  . Alcohol use Yes     Comment: occasional but may drink several drinks or beers   . Drug use: No  . Sexual activity: Not on file   Other Topics Concern  . Not on file   Social History Narrative  . No narrative on file      ROS:  General: Negative for anorexia, weight loss, fever, chills, fatigue, weakness. Eyes: Negative for vision changes.  ENT: Negative for hoarseness, difficulty swallowing , nasal congestion. CV: Negative for chest pain, angina, palpitations, dyspnea on exertion, peripheral edema.  Respiratory: Negative for dyspnea at rest, dyspnea on exertion, cough, sputum, wheezing.  GI: See history of present illness. GU:  Negative for dysuria, hematuria, urinary incontinence, urinary frequency, nocturnal urination.  MS: Negative for joint pain, low back pain.  Derm: Negative for rash or itching.  Neuro: Negative for weakness, abnormal sensation, seizure, frequent headaches, memory loss, confusion.  Psych: Negative for anxiety, depression, suicidal ideation, hallucinations.  Endo: Negative for unusual weight change.  Heme: Negative for bruising or bleeding. Allergy: Negative for rash or hives.    Physical Examination:  BP 130/76   Pulse 66   Temp 97.6 F (36.4 C) (Oral)   Ht 5\' 9"  (1.753 m)   Wt 237 lb 9.6 oz (107.8 kg)   BMI 35.09 kg/m    General: Well-nourished, well-developed in no acute distress.  Head: Normocephalic, atraumatic.   Eyes: Conjunctiva pink, no icterus. Mouth: Oropharyngeal mucosa moist and pink , no lesions erythema or exudate. Neck: Supple without thyromegaly, masses, or lymphadenopathy.  Lungs: Clear to auscultation bilaterally.  Heart: Regular rate and rhythm, no murmurs rubs or gallops.  Abdomen: Bowel sounds are normal, mild LLQ tenderness, nondistended, no hepatosplenomegaly or masses, no abdominal bruits or    hernia , no rebound or guarding.    Rectal: not performed Extremities: No lower extremity edema. No clubbing or deformities.  Neuro: Alert and oriented x 4 , grossly normal neurologically.  Skin: Warm and dry, no rash or jaundice.   Psych: Alert and cooperative, normal mood and affect.  Labs: Lab Results  Component Value Date   WBC 12.0 (H) 10/20/2016   HGB 14.5 10/20/2016   HCT 43.0 10/20/2016   MCV 86.5 10/20/2016   PLT 226 10/20/2016   Lab Results  Component Value Date   CREATININE 1.00 10/20/2016   BUN 12 10/20/2016   NA 134 (L) 10/20/2016   K 4.0 10/20/2016   CL 99 (L) 10/20/2016   CO2 27 10/20/2016   Lab Results  Component Value Date   ALT 26 10/20/2016   AST 26 10/20/2016   ALKPHOS 93 10/20/2016   BILITOT 0.8 10/20/2016     Imaging Studies: Ct Abdomen Pelvis W Contrast  Result Date: 10/20/2016 CLINICAL DATA:  Abdominal pain for 3 days, diarrhea and bright red blood  in stools. History of diverticulosis. EXAM: CT ABDOMEN AND PELVIS WITH CONTRAST TECHNIQUE: Multidetector CT imaging of the abdomen and pelvis was performed using the standard protocol following bolus administration of intravenous contrast. CONTRAST:  100mL ISOVUE-300 IOPAMIDOL (ISOVUE-300) INJECTION 61% COMPARISON:  None. FINDINGS: LOWER CHEST: Lung bases are clear. Included heart size is normal. No pericardial effusion. HEPATOBILIARY: Liver and gallbladder are normal. PANCREAS: Normal. SPLEEN: Normal. ADRENALS/URINARY TRACT: Kidneys are orthotopic, demonstrating symmetric enhancement. No nephrolithiasis, hydronephrosis or solid renal masses. The unopacified ureters are normal in course and caliber. Urinary bladder is partially distended and unremarkable. Normal adrenal glands. STOMACH/BOWEL: Enteric contrast has not yet reached the distal small bowel. Mild diffuse colonic wall thickening and inflammation most apparent within the RIGHT colon again, rectosigmoid junction. No bowel obstruction. The stomach, small bowel are normal in course and  caliber without inflammatory changes. Normal appendix. VASCULAR/LYMPHATIC: Aortoiliac vessels are normal in course and caliber. No lymphadenopathy by CT size criteria. REPRODUCTIVE: Normal. OTHER: No intraperitoneal free fluid or free air. MUSCULOSKELETAL: Nonacute.  Small fat containing inguinal hernias. IMPRESSION: Colitis, which may be infectious or inflammatory. No diverticulitis or appendicitis. Electronically Signed   By: Awilda Metroourtnay  Bloomer M.D.   On: 10/20/2016 22:24

## 2016-10-23 NOTE — Assessment & Plan Note (Signed)
36 year old gentleman presenting for follow-up of recent onset bloody diarrhea, abdominal pain. At baseline patient has intermittent bright red blood per rectum on the toilet tissue felt to be related to hemorrhoids. Reports colonoscopy 2012 at [facility], had hemorrhoids and diverticulosis per patient. Recently had 48 hour illness with diarrhea, vomiting. Symptoms subsided. Wife and mother-in-law also sick after that. However last weekend he developed acute onset diarrhea associated with left-sided abdominal pain bloody stool after 2 days of illness. Went to the emergency department for evaluation. CT with evidence of colitis as outlined. Mild leukocytosis. Hemoglobin normal. Started on Cipro and Flagyl. 3 days later he is 50-75% better. Suspect infectious etiology. As patient is improving, would not change antibiotic therapy or collect stool specimen at this time. He is exposed to chickens on a daily basis therefore if he has ongoing symptoms or worsening of symptoms, would consider changing therapy for better coverage for Campylobacter. We discussed at length consideration of colonoscopy given the abnormal CT findings as well as chronic intermittent rectal bleeding. I suspect current illness related to infectious etiology rather than inflammatory bowel disease cannot exclude. Encouraged patient to come back in 4 weeks to reassess his symptoms and decide about colonoscopy at that time. Patient reluctant colonoscopy as of right now.

## 2016-10-23 NOTE — Progress Notes (Signed)
No pcp per patient 

## 2016-10-23 NOTE — Patient Instructions (Signed)
1. Return to the office in four weeks.  Will discuss need for colonoscopy at that time based on symptoms.  2. Complete antibiotics. Decrease metronidazole (Flagyl) to three times daily. Call if questions or concerns for ongoing symptoms.

## 2016-11-23 ENCOUNTER — Ambulatory Visit: Payer: 59 | Admitting: Gastroenterology

## 2016-11-25 ENCOUNTER — Ambulatory Visit: Payer: 59 | Admitting: Gastroenterology

## 2018-05-10 ENCOUNTER — Emergency Department (HOSPITAL_COMMUNITY): Payer: 59

## 2018-05-10 ENCOUNTER — Encounter (HOSPITAL_COMMUNITY): Payer: Self-pay | Admitting: Emergency Medicine

## 2018-05-10 ENCOUNTER — Ambulatory Visit (HOSPITAL_COMMUNITY)
Admission: EM | Admit: 2018-05-10 | Discharge: 2018-05-10 | Disposition: A | Discharge status: Home / Self Care | Payer: 59 | Source: Home / Self Care

## 2018-05-10 ENCOUNTER — Emergency Department (HOSPITAL_COMMUNITY)
Admission: EM | Admit: 2018-05-10 | Discharge: 2018-05-10 | Disposition: A | Discharge status: Home / Self Care | Payer: 59 | Attending: Emergency Medicine | Admitting: Emergency Medicine

## 2018-05-10 DIAGNOSIS — Y92009 Unspecified place in unspecified non-institutional (private) residence as the place of occurrence of the external cause: Secondary | ICD-10-CM | POA: Insufficient documentation

## 2018-05-10 DIAGNOSIS — S61219A Laceration without foreign body of unspecified finger without damage to nail, initial encounter: Secondary | ICD-10-CM | POA: Diagnosis not present

## 2018-05-10 DIAGNOSIS — S67193A Crushing injury of left middle finger, initial encounter: Secondary | ICD-10-CM | POA: Diagnosis not present

## 2018-05-10 DIAGNOSIS — Y939 Activity, unspecified: Secondary | ICD-10-CM | POA: Insufficient documentation

## 2018-05-10 DIAGNOSIS — Y999 Unspecified external cause status: Secondary | ICD-10-CM | POA: Diagnosis not present

## 2018-05-10 DIAGNOSIS — W230XXA Caught, crushed, jammed, or pinched between moving objects, initial encounter: Secondary | ICD-10-CM | POA: Insufficient documentation

## 2018-05-10 DIAGNOSIS — Z79899 Other long term (current) drug therapy: Secondary | ICD-10-CM | POA: Diagnosis not present

## 2018-05-10 DIAGNOSIS — S61209A Unspecified open wound of unspecified finger without damage to nail, initial encounter: Secondary | ICD-10-CM

## 2018-05-10 DIAGNOSIS — Z23 Encounter for immunization: Secondary | ICD-10-CM | POA: Diagnosis not present

## 2018-05-10 MED ORDER — CEPHALEXIN 500 MG PO CAPS: 500.0000 mg | ORAL_CAPSULE | Freq: Four times a day (QID) | ORAL | 0 refills | Status: AC

## 2018-05-10 MED ORDER — LIDOCAINE-EPINEPHRINE (PF) 2 %-1:200000 IJ SOLN
20.0000 mL | Freq: Once | INTRAMUSCULAR | Status: AC
  Administered 2018-05-10: 20 mL
  Filled 2018-05-10: qty 20

## 2018-05-10 MED ORDER — OXYCODONE-ACETAMINOPHEN 5-325 MG PO TABS
1.0000 | ORAL_TABLET | ORAL | Status: DC | PRN
  Administered 2018-05-10: 1 via ORAL
  Filled 2018-05-10: qty 1

## 2018-05-10 MED ORDER — TETANUS-DIPHTH-ACELL PERTUSSIS 5-2.5-18.5 LF-MCG/0.5 IM SUSP
0.5000 mL | Freq: Once | INTRAMUSCULAR | Status: AC
  Administered 2018-05-10: 0.5 mL via INTRAMUSCULAR
  Filled 2018-05-10: qty 0.5

## 2018-05-10 MED ORDER — CEPHALEXIN 250 MG PO CAPS
1000.0000 mg | ORAL_CAPSULE | Freq: Once | ORAL | Status: AC
  Administered 2018-05-10: 1000 mg via ORAL
  Filled 2018-05-10: qty 4

## 2018-05-10 NOTE — ED Provider Notes (Signed)
[facility] EMERGENCY DEPARTMENT Provider Note   CSN: [id number] Arrival date & time: 05/10/18  0946     History   Chief Complaint Chief Complaint  Patient presents with  . Hand Injury    HPI Nicolas Patel is a 37 y.o. male.  Patient indicates accidentally dropped a piece of steel on distal tip of left middle finger today just pta, at home. Pain to area, moderate, constant, worse w palpation. No numbness or loss of ability to move digit. Tetanus unknown. Denies other pain or injury. Is left hand dominant.   The history is provided by the patient.  Hand Injury   Pertinent negatives include no fever.    Past Medical History:  Diagnosis Date  . Diverticulosis     Patient Active Problem List   Diagnosis Date Noted  . Bloody diarrhea 10/23/2016  . Abnormal CT scan, colon 10/23/2016    Past Surgical History:  Procedure Laterality Date  . COLONOSCOPY  2012   HEMORRHOIDS, [facility]  . HERNIA REPAIR  2002   RT INGUINAL  . LUMBAR LAMINECTOMY/DECOMPRESSION MICRODISCECTOMY  12/17/2011   Procedure: LUMBAR LAMINECTOMY/DECOMPRESSION MICRODISCECTOMY;  Surgeon: Clydene Fake, MD;  Location: [facility];  Service: Neurosurgery;  Laterality: Right;  Right Lumbar Five-Sacral One Diskectomy  . WISDOM TOOTH EXTRACTION  2013        Home Medications    Prior to Admission medications   Medication Sig Start Date End Date Taking? Authorizing Provider  ciprofloxacin (CIPRO) 500 MG tablet Take 1 tablet (500 mg total) by mouth 2 (two) times daily. One po bid x 7 days 10/20/16   Bethann Berkshire, MD  metroNIDAZOLE (FLAGYL) 500 MG tablet Take 1 tablet (500 mg total) by mouth 4 (four) times daily. One po bid x 7 days 10/20/16   Bethann Berkshire, MD  Multiple Vitamin (MULITIVITAMIN WITH MINERALS) TABS Take 1 tablet by mouth daily.    [provider]    Family History Family History  Problem Relation Age of Onset  . Anesthesia problems Neg Hx   . Colon cancer Neg Hx     . Inflammatory bowel disease Neg Hx     Social History Social History   Tobacco Use  . Smoking status: Never Smoker  . Smokeless tobacco: Former Neurosurgeon    Types: Chew  Substance Use Topics  . Alcohol use: Yes    Comment: occasional but may drink several drinks or beers   . Drug use: No     Allergies   Patient has no known allergies.   Review of Systems Review of Systems  Constitutional: Negative for fever.  Gastrointestinal: Negative for vomiting.  Skin: Positive for wound.  Neurological: Negative for numbness.     Physical Exam Updated Vital Signs BP (!) 144/96 (BP Location: Right Arm)   Pulse (!) 55   Temp 98.3 F (36.8 C) (Oral)   Resp 16   SpO2 100%   Physical Exam  Constitutional: He appears well-developed and well-nourished.  HENT:  Head: Atraumatic.  Eyes: Conjunctivae are normal.  Neck: Neck supple. No tracheal deviation present.  Cardiovascular: Intact distal pulses.  Pulmonary/Chest: Effort normal. No accessory muscle usage. No respiratory distress.  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 The flat/tip is attached on the volar surface of the distal phalanx.   Neurological: He is alert.  Skin: Skin is warm and dry. He is not diaphoretic.  Psychiatric: He has a normal mood and affect.  Nursing note  and vitals reviewed.    ED Treatments / Results  Labs (all labs ordered are listed, but only abnormal results are displayed) Labs Reviewed - No data to display  EKG None  Radiology Dg Finger Middle Left  Result Date: 05/10/2018 CLINICAL DATA:  Partial amputation left middle finger all at 8:30 this morning EXAM: LEFT MIDDLE FINGER 2+V COMPARISON:  None. FINDINGS: Soft tissue laceration involving the dorsal aspect of the tip of the third digit with out associated fracture or radiopaque foreign body however the laceration does appear to extend to expose the tuft of the distal phalanx. Joint spaces are preserved. IMPRESSION:  Soft tissue laceration involving the tip of the third digit without fracture or radiopaque foreign body, however the laceration does appear to expose the tuft of the distal phalanx. Clinical correlation is advised. Electronically Signed   By: Simonne ComeJohn  Watts M.D.   On: 05/10/2018 10:59    Procedures .Marland Kitchen.Laceration Repair Date/Time: 05/10/2018 12:29 PM Performed by: Cathren LaineSteinl, Nesanel Aguila, MD Authorized by: Cathren LaineSteinl, Nolyn Eilert, MD   Anesthesia (see MAR for exact dosages):    Anesthesia method:  Nerve block (sterile prep for block)   Block location:  Left middle finger   Block needle gauge:  25 G   Block anesthetic:  Lidocaine 2% WITH epi   Block technique:  Digit block   Block outcome:  Anesthesia achieved Laceration details:    Location:  Finger   Finger location:  L long finger   Length (cm):  3 Repair type:    Repair type:  Intermediate Pre-procedure details:    Preparation:  Patient was prepped and draped in usual sterile fashion and imaging obtained to evaluate for foreign bodies Exploration:    Wound exploration: wound explored through full range of motion     Wound extent: no foreign bodies/material noted     Contaminated: no   Treatment:    Area cleansed with:  Betadine and saline   Amount of cleaning:  Extensive   Irrigation solution:  Sterile saline   Irrigation method:  Syringe   Visualized foreign bodies/material removed: no   Skin repair:    Repair method:  Sutures   Suture size:  4-0   Suture material:  Prolene   Number of sutures:  8 Approximation:    Approximation:  Close Post-procedure details:    Dressing:  Sterile dressing   Patient tolerance of procedure:  Tolerated well, no immediate complications   (including critical care time)  Medications Ordered in ED Medications  oxyCODONE-acetaminophen (PERCOCET/ROXICET) 5-325 MG per tablet 1 tablet (1 tablet Oral Given 05/10/18 1024)  lidocaine-EPINEPHrine (XYLOCAINE W/EPI) 2 %-1:200000 (PF) injection 20 mL (has no  administration in time range)  Tdap (BOOSTRIX) injection 0.5 mL (has no administration in time range)  cephALEXin (KEFLEX) capsule 1,000 mg (has no administration in time range)     Initial Impression / Assessment and Plan / ED Course  I have reviewed the triage vital signs and the nursing notes.  Pertinent labs & imaging results that were available during my care of the patient were reviewed by me and considered in my medical decision making (see chart for details).  Xrays. Tetanus im.  Digit block. Distal flap/tip sutured to cover open wound. Keflex 1 gm po.   xrays reviewed - no fx.   Reviewed nursing notes and prior charts for additional history.   Sutured. Splinted. Excellent anesthesia during procedure.   rx for home.   Final Clinical Impressions(s) / ED Diagnoses   Final diagnoses:  None    ED Discharge Orders    None       Cathren Laine, MD 05/10/18 1232

## 2018-05-10 NOTE — ED Triage Notes (Signed)
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 Hemorrhage controlled. Last tetanus 4 years ago. Patient alert, oriented, and in no apparent distress at this time.

## 2018-05-10 NOTE — Discharge Instructions (Addendum)
It was our pleasure to provide your ER care today - we hope that you feel better.  Elevate finger for next 2-3 days to help with swelling/pain.  Keep wound very clean. Wear finger splint for comfort/support.   Have sutures removed in 10-12 days.  Take antibiotic (keflex) as prescribed. Take acetaminophen and/or ibuprofen as need for pain.  Return to ER if worse, spreading redness, pus from wound, other concern.

## 2018-05-10 NOTE — ED Notes (Signed)
Pt in XRAY 

## 2018-07-03 IMAGING — CT CT ABD-PELV W/ CM
2 of 4 series · 16 of 46 positions shown, 18 images · IV contrast (Isovue)
Comparison: None.

CLINICAL DATA: Abdominal pain for 3 days, diarrhea and bright red
blood in stools. History of diverticulosis.

EXAM:
CT ABDOMEN AND PELVIS WITH CONTRAST
TECHNIQUE: Multidetector CT imaging of the abdomen and pelvis was performed
using the standard protocol following bolus administration of
intravenous contrast.
CONTRAST:  100mL 5H2SF1-XVV IOPAMIDOL (5H2SF1-XVV) INJECTION 61%

[Series 2: axial st · axial · 0.88mm/px · z∈[+706,+1216]mm · 13 of 112 slices shown, 15 images]
[im 5/112  soft-tissue]
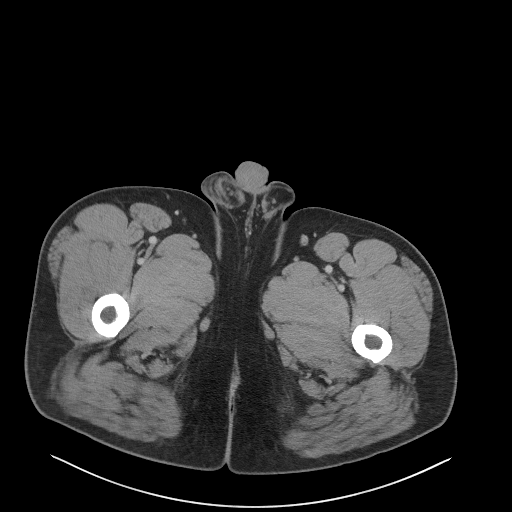
[im 5/112  bone]
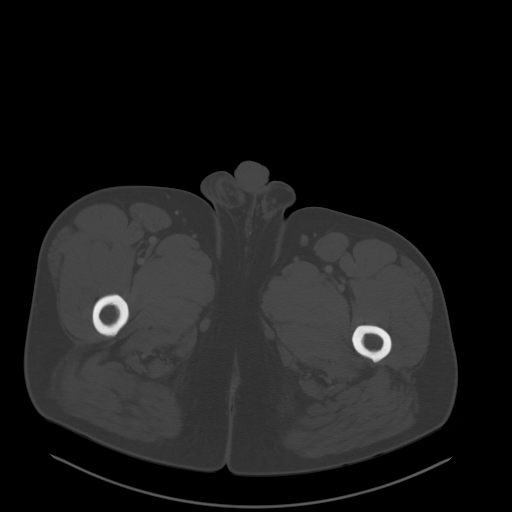
[im 15/112  soft-tissue]
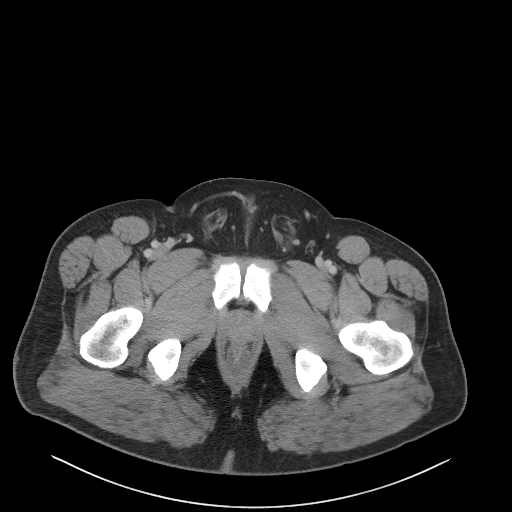
[im 25/112  soft-tissue]
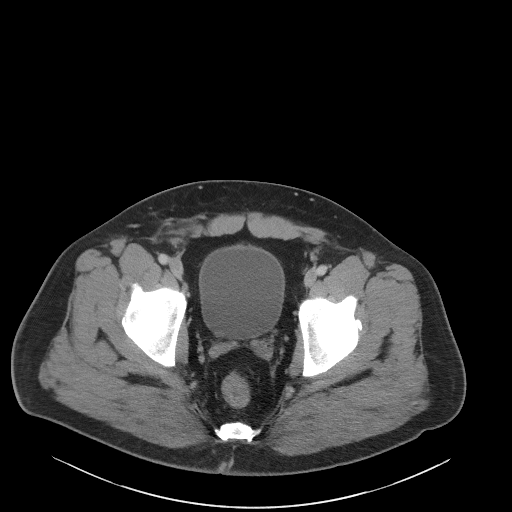
[im 29/112  soft-tissue]
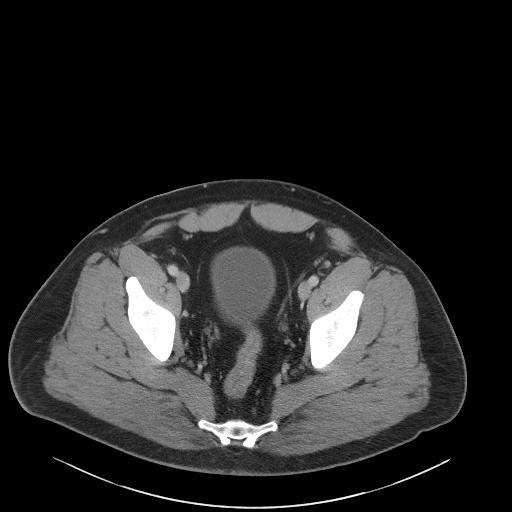
[im 39/112  soft-tissue]
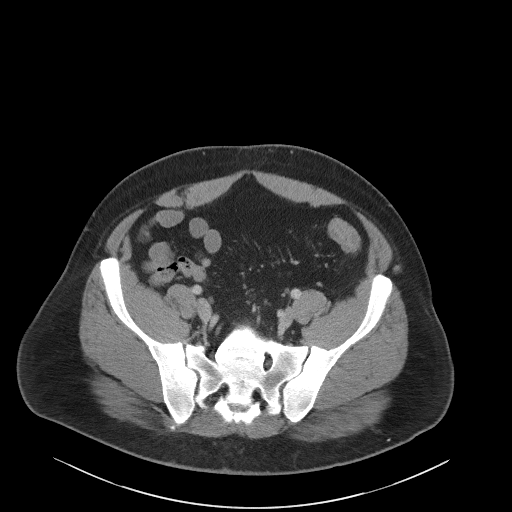
[im 49/112  soft-tissue]
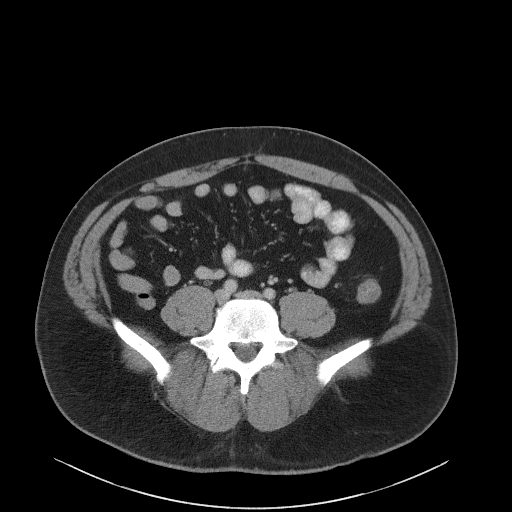
[im 58/112  soft-tissue]
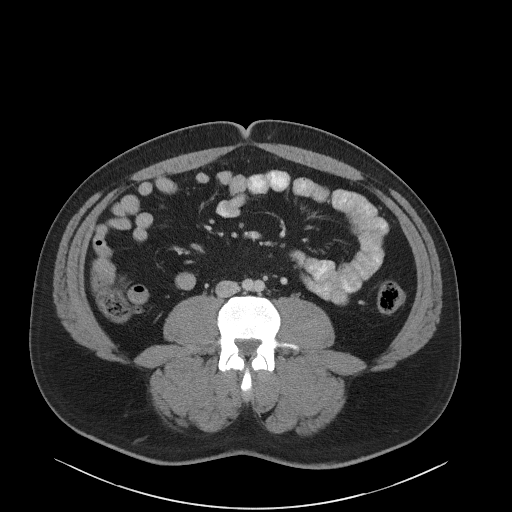
[im 63/112  soft-tissue]
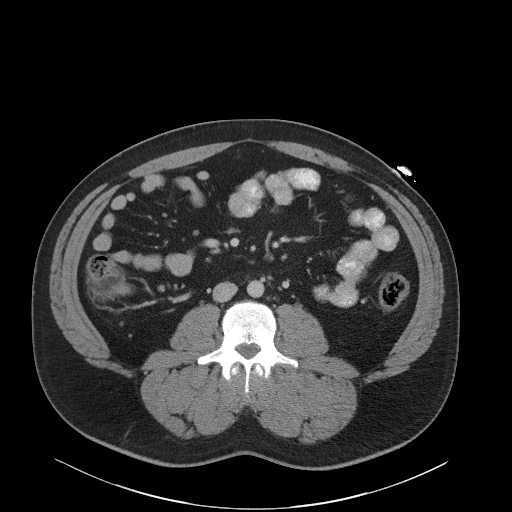
[im 73/112  soft-tissue]
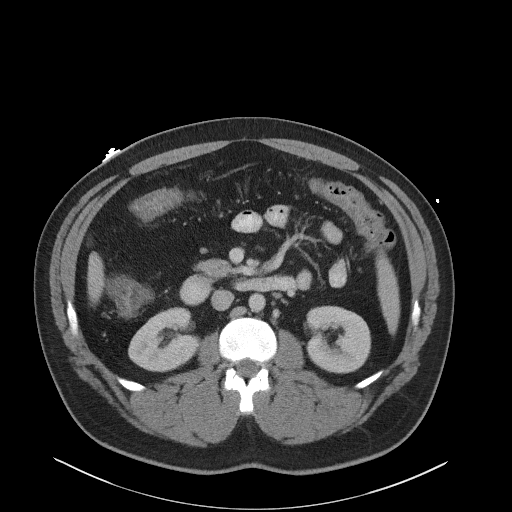
[im 73/112  bone]
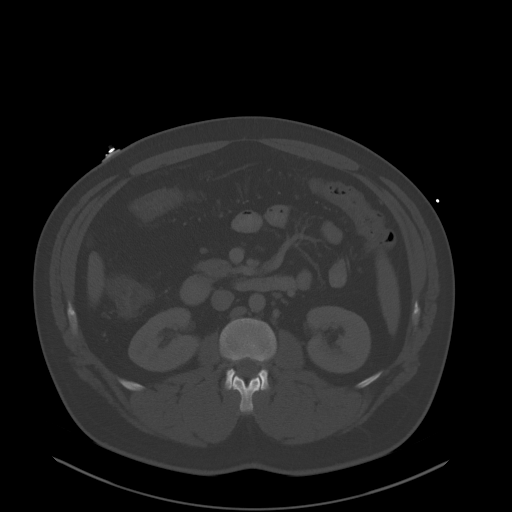
[im 83/112  soft-tissue]
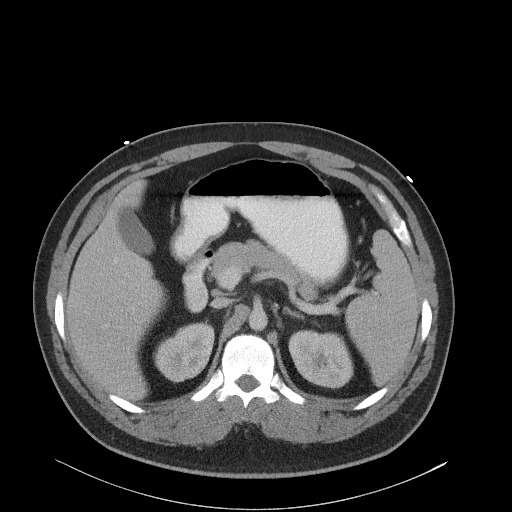
[im 87/112  soft-tissue]
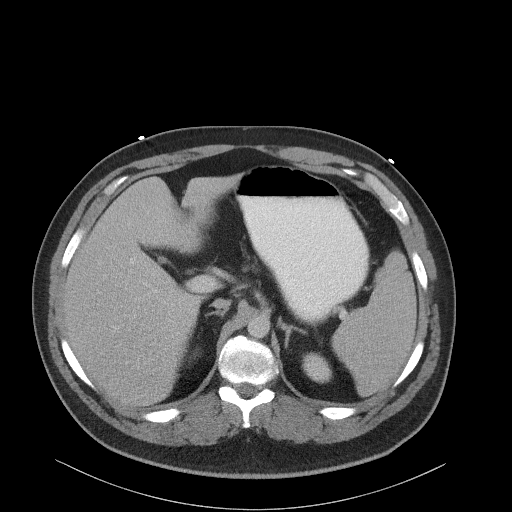
[im 97/112  soft-tissue]
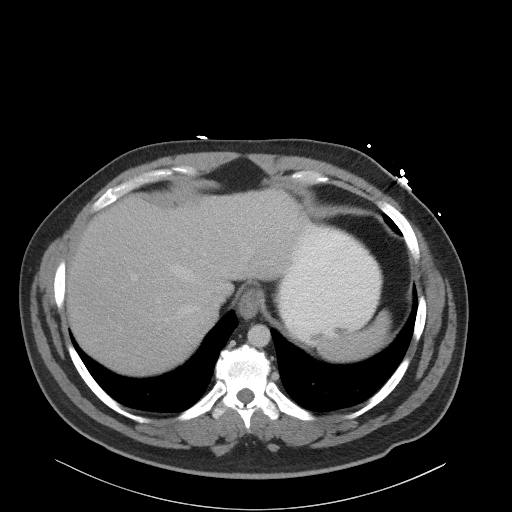
[im 107/112  soft-tissue]
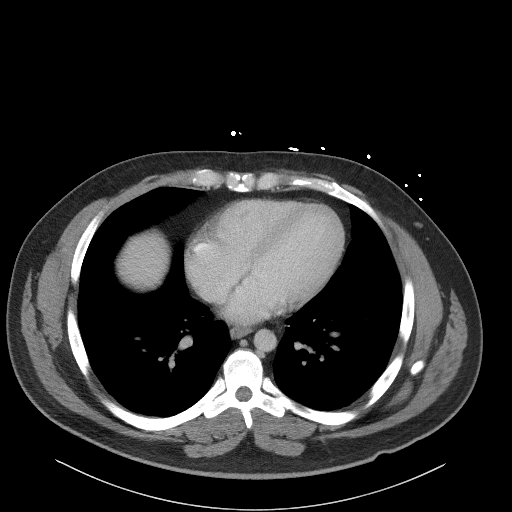

[Series 4: coronal st · coronal · 0.97mm/px · 3 of 101 slices shown]
[im 34/101  soft-tissue]
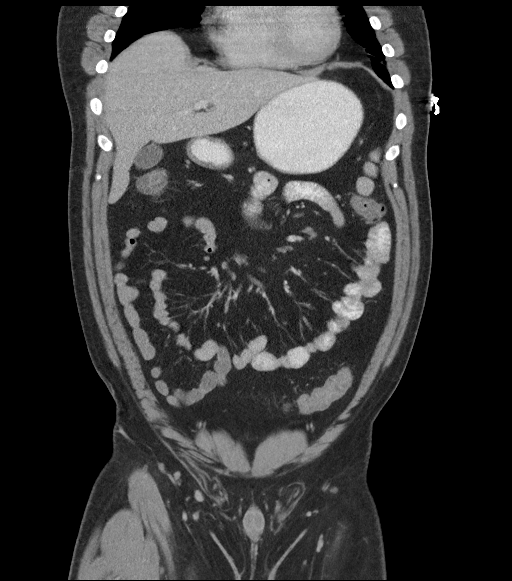
[im 45/101  soft-tissue]
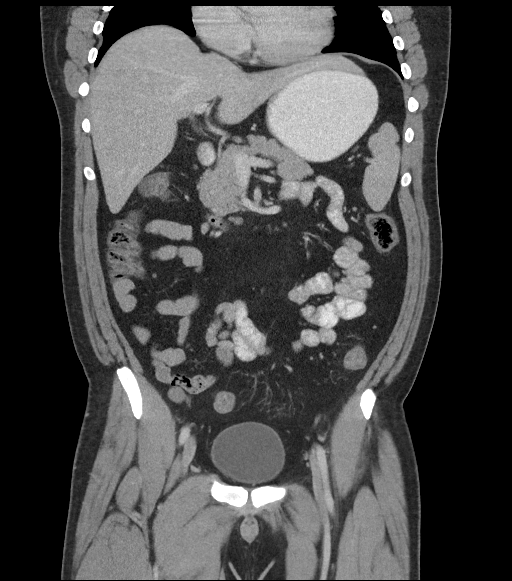
[im 56/101  soft-tissue]
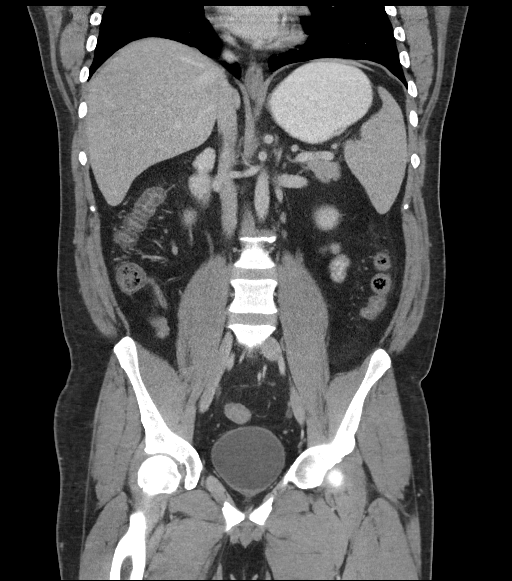

[16 of 46 positions shown; findings below may reference images not displayed]

FINDINGS: LOWER CHEST: Lung bases are clear. Included heart size is normal. No
pericardial effusion.

HEPATOBILIARY: Liver and gallbladder are normal.

PANCREAS: Normal.

SPLEEN: Normal.

ADRENALS/URINARY TRACT: Kidneys are orthotopic, demonstrating
symmetric enhancement. No nephrolithiasis, hydronephrosis or solid
renal masses. The unopacified ureters are normal in course and
caliber. Urinary bladder is partially distended and unremarkable.
Normal adrenal glands.

STOMACH/BOWEL: Enteric contrast has not yet reached the distal small
bowel. Mild diffuse colonic wall thickening and inflammation most
apparent within the RIGHT colon again, rectosigmoid junction. No
bowel obstruction. The stomach, small bowel are normal in course and
caliber without inflammatory changes. Normal appendix.

VASCULAR/LYMPHATIC: Aortoiliac vessels are normal in course and
caliber. No lymphadenopathy by CT size criteria.

REPRODUCTIVE: Normal.

OTHER: No intraperitoneal free fluid or free air.

MUSCULOSKELETAL: Nonacute.  Small fat containing inguinal hernias.
IMPRESSION: Colitis, which may be infectious or inflammatory. No diverticulitis
or appendicitis.

## 2020-01-21 IMAGING — DX DG FINGER MIDDLE 2+V*L*
3 series · 3 of 3 positions shown · non-contrast
Comparison: None.

CLINICAL DATA: Partial amputation left middle finger all at [DATE]
this morning

EXAM:
LEFT MIDDLE FINGER 2+V

[finger ap]
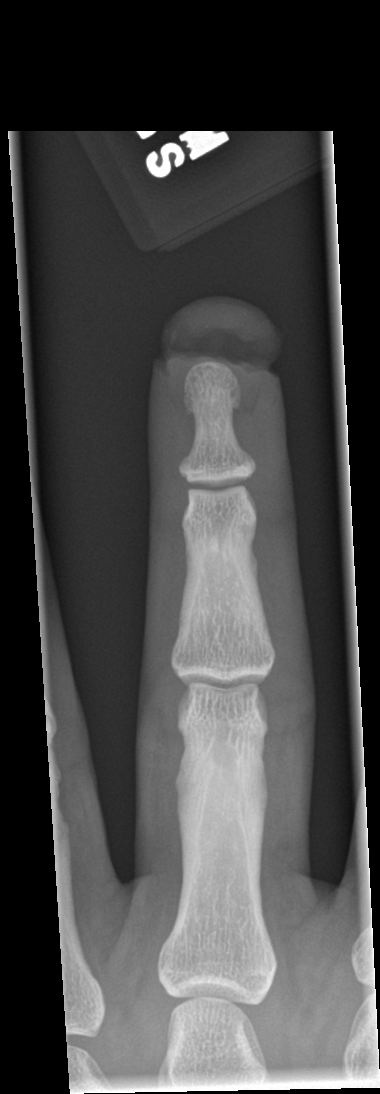

[finger obl]
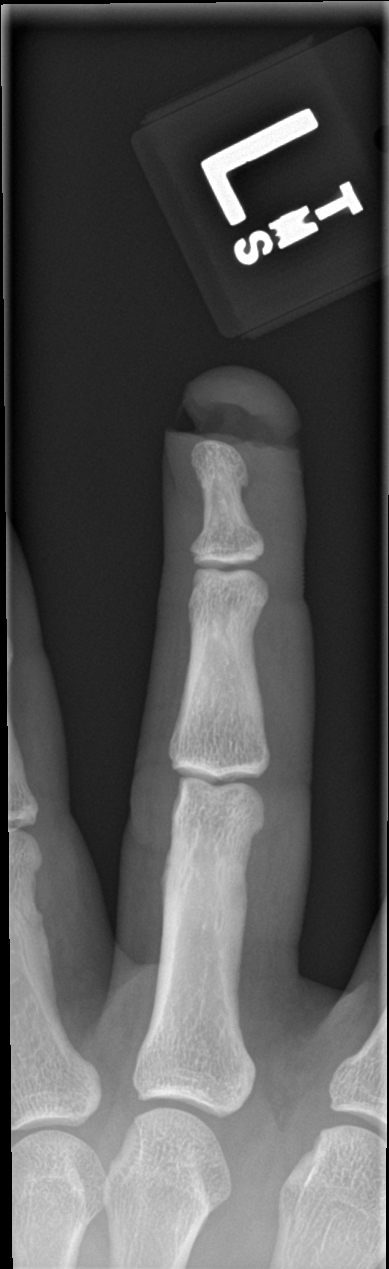

[finger lat]
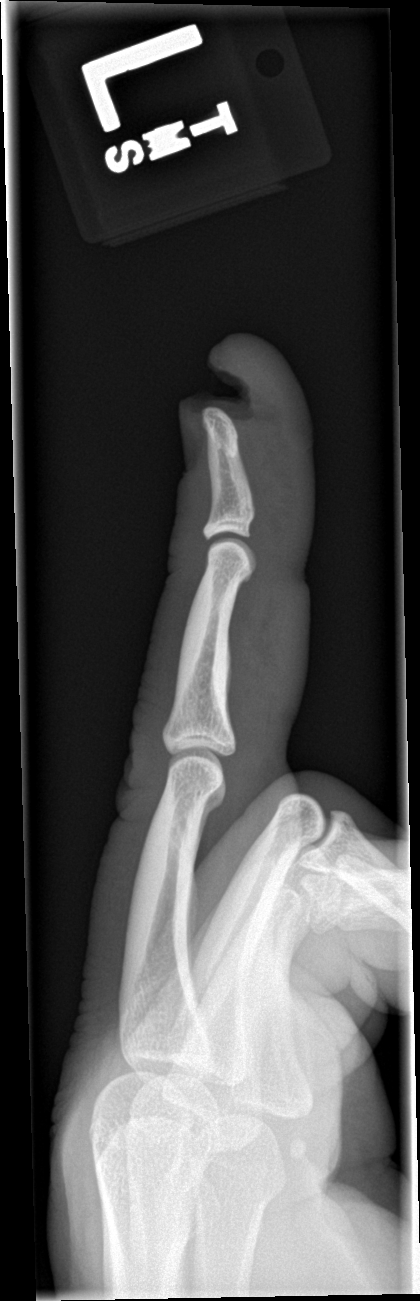

[3 of 3 positions shown; findings below may reference images not displayed]

FINDINGS: Soft tissue laceration involving the dorsal aspect of the tip of the
third digit with out associated fracture or radiopaque foreign body
however the laceration does appear to extend to expose the tuft of
the distal phalanx. Joint spaces are preserved.
IMPRESSION: Soft tissue laceration involving the tip of the third digit without
fracture or radiopaque foreign body, however the laceration does
appear to expose the tuft of the distal phalanx. Clinical
correlation is advised.
